# Patient Record
Sex: Female | Born: 1935 | Race: White | Hispanic: No | Smoking: Never smoker
Health system: Southern US, Community
[De-identification: ages and names within clinical notes are randomized; demographics above are authoritative.]

## PROBLEM LIST (undated history)

## (undated) DIAGNOSIS — I10 Essential (primary) hypertension: Secondary | ICD-10-CM

## (undated) DIAGNOSIS — N289 Disorder of kidney and ureter, unspecified: Secondary | ICD-10-CM

## (undated) DIAGNOSIS — K5190 Ulcerative colitis, unspecified, without complications: Secondary | ICD-10-CM

## (undated) HISTORY — PX: CATARACT EXTRACTION: SUR2

## (undated) HISTORY — PX: BREAST SURGERY: SHX581

---

## 2010-11-04 ENCOUNTER — Emergency Department (HOSPITAL_BASED_OUTPATIENT_CLINIC_OR_DEPARTMENT_OTHER)
Admission: EM | Admit: 2010-11-04 | Discharge: 2010-11-04 | Payer: Self-pay | Source: Home / Self Care | Admitting: Emergency Medicine

## 2010-11-04 ENCOUNTER — Ambulatory Visit: Payer: Self-pay | Admitting: Diagnostic Radiology

## 2016-08-24 ENCOUNTER — Emergency Department (HOSPITAL_BASED_OUTPATIENT_CLINIC_OR_DEPARTMENT_OTHER)
Admission: EM | Admit: 2016-08-24 | Discharge: 2016-08-24 | Disposition: A | Discharge status: Home / Self Care | Payer: Medicare HMO | Attending: Emergency Medicine | Admitting: Emergency Medicine

## 2016-08-24 ENCOUNTER — Encounter (HOSPITAL_BASED_OUTPATIENT_CLINIC_OR_DEPARTMENT_OTHER): Payer: Self-pay | Admitting: Adult Health

## 2016-08-24 DIAGNOSIS — K625 Hemorrhage of anus and rectum: Secondary | ICD-10-CM | POA: Diagnosis present

## 2016-08-24 DIAGNOSIS — K642 Third degree hemorrhoids: Secondary | ICD-10-CM | POA: Insufficient documentation

## 2016-08-24 DIAGNOSIS — I10 Essential (primary) hypertension: Secondary | ICD-10-CM | POA: Diagnosis not present

## 2016-08-24 HISTORY — DX: Disorder of kidney and ureter, unspecified: N28.9

## 2016-08-24 HISTORY — DX: Ulcerative colitis, unspecified, without complications: K51.90

## 2016-08-24 HISTORY — DX: Essential (primary) hypertension: I10

## 2016-08-24 LAB — CBC
HCT: 35.2 % — ABNORMAL LOW (ref 36.0–46.0)
Hemoglobin: 11.4 g/dL — ABNORMAL LOW (ref 12.0–15.0)
MCH: 30.4 pg (ref 26.0–34.0)
MCHC: 32.4 g/dL (ref 30.0–36.0)
MCV: 93.9 fL (ref 78.0–100.0)
PLATELETS: 232 10*3/uL (ref 150–400)
RBC: 3.75 MIL/uL — AB (ref 3.87–5.11)
RDW: 14.1 % (ref 11.5–15.5)
WBC: 5.8 10*3/uL (ref 4.0–10.5)

## 2016-08-24 MED ORDER — HYDROCORTISONE ACE-PRAMOXINE 1-1 % RE FOAM: 1.0000 | Freq: Two times a day (BID) | RECTAL | 0 refills | Status: AC

## 2016-08-24 NOTE — ED Notes (Signed)
EDP at [facility] 

## 2016-08-24 NOTE — ED Triage Notes (Signed)
Presents with rectal bleeding began about 30 minutes ago, one episode of large bloody stool with bright red blood and clots. HX of ulcerative colitis, denies use of blood thinners, denies abdominal pain. Endorses fatigue.

## 2016-08-24 NOTE — ED Provider Notes (Signed)
[facility] Note   CSN: [id number] Arrival date & time: 08/24/16  2016   By signing my name below, I, Nelwyn Salisbury, attest that this documentation has been prepared under the direction and in the presence of Lyndal Pulley, MD . Electronically Signed: Nelwyn Salisbury, Scribe. 08/24/2016. 8:34 PM.  History   Chief Complaint Chief Complaint  Patient presents with  . Rectal Bleeding   The history is provided by the patient and a relative. No language interpreter was used.  Rectal Bleeding  Quality:  Bright red Amount:  Moderate Duration:  1 hour Timing:  Constant Chronicity:  New Context: defecation and hemorrhoids   Relieved by:  Nothing Worsened by:  Nothing Ineffective treatments:  None tried Associated symptoms: no abdominal pain, no dizziness and no fever     HPI Comments:  Ariana Allen is a 82 y.o. female with PMHx of HTN and UC who presents to the Emergency Department complaining of sudden-onset severe rectal bleeding beginning about an hour ago. Pt reports that initially there was a large amount of blood, but it seems to be gradually improving. Pt denies any fever, dizziness or hx of anemia.  Past Medical History:  Diagnosis Date  . Hypertension   . Renal disorder   . Ulcerative colitis (HCC)     There are no active problems to display for this patient.   History reviewed. No pertinent surgical history.  OB History    No data available       Home Medications    Prior to Admission medications   Not on File    Family History History reviewed. No pertinent family history.  Social History Social History  Substance Use Topics  . Smoking status: Never Smoker  . Smokeless tobacco: Current User  . Alcohol use No     Allergies   Nitrofuran derivatives   Review of Systems Review of Systems  Constitutional: Negative for fever.  Gastrointestinal: Positive for anal bleeding and hematochezia. Negative for abdominal pain.    Neurological: Negative for dizziness.  All other systems reviewed and are negative.    Physical Exam Updated Vital Signs BP 156/60 (BP Location: Left Arm)   Pulse 68   Temp 97.6 F (36.4 C) (Oral)   Resp 18   Wt 196 lb (88.9 kg)   SpO2 96%   Physical Exam  Constitutional: She appears well-developed and well-nourished. No distress.  HENT:  Head: Normocephalic and atraumatic.  Eyes: Conjunctivae are normal.  Neck: Neck supple.  Cardiovascular: Normal rate and regular rhythm.   No murmur heard. Pulmonary/Chest: Effort normal and breath sounds normal. No respiratory distress.  Abdominal: Soft. There is no tenderness.  Genitourinary:  Genitourinary Comments: Multiple large external hemorrhoids. No gross blood. Soft, non-thrombosed.  Musculoskeletal: She exhibits no edema.  Neurological: She is alert.  Skin: Skin is warm and dry.  Psychiatric: She has a normal mood and affect.  Nursing note and vitals reviewed.    ED Treatments / Results  DIAGNOSTIC STUDIES:  Oxygen Saturation is 96% on RA, normal by my interpretation.    COORDINATION OF CARE:  8:50 PM Discussed treatment plan with pt at bedside which included blood work and pt agreed to plan.  Labs (all labs ordered are listed, but only abnormal results are displayed) Labs Reviewed  CBC - Abnormal; Notable for the following:       Result Value   RBC 3.75 (*)    Hemoglobin 11.4 (*)    HCT 35.2 (*)  All other components within normal limits    EKG  EKG Interpretation None       Radiology No results found.  Procedures Procedures (including critical care time)  Medications Ordered in ED Medications - No data to display   Initial Impression / Assessment and Plan / ED Course  I have reviewed the triage vital signs and the nursing notes.  Pertinent labs & imaging results that were available during my care of the patient were reviewed by me and considered in my medical decision making (see chart for  details).  Clinical Course    82 y.o. female presents with Rectal bleeding that started tonight and was bright red with small clots. It has since subsided. She has several large protruding external hemorrhoids which were easily reducible that are not actively bleeding. No gross blood on rectal exam. CBC with hemoglobin of 11. I recommended hydrocortisone foam to help constrict the hemorrhoids with stool softeners and other supportive care measures pending outpatient follow-up. Return precautions were discussed for signs and symptoms of continued bleeding or anemia.  Final Clinical Impressions(s) / ED Diagnoses   Final diagnoses:  Rectal bleeding  Third degree hemorrhoids    New Prescriptions Discharge Medication List as of 08/24/2016  9:47 PM    START taking these medications   Details  hydrocortisone-pramoxine (PROCTOFOAM HC) rectal foam Place 1 applicator rectally 2 (two) times daily., Starting Sat 08/24/2016, Until Tue 09/03/2016, Print      I personally performed the services described in this documentation, which was scribed in my presence. The recorded information has been reviewed and is accurate.     Lyndal Pulleyaniel Loma [place], MD 08/24/16 [id number]

## 2016-08-24 NOTE — ED Notes (Signed)
Pt alert, NAD, calm, interactive, resps e/u, speaking in clear complete sentences, c/o rectal bleeding, bright red, (denies: nvd, constipation, fever, dizziness, sob, visual changes, back or abd pain, urinary or vaginal sx), h/o hemorrhoids and colitis. [facility]

## 2018-01-12 ENCOUNTER — Encounter (HOSPITAL_COMMUNITY): Payer: Self-pay | Admitting: Family Medicine

## 2018-01-12 ENCOUNTER — Emergency Department (HOSPITAL_COMMUNITY)
Admission: EM | Admit: 2018-01-12 | Discharge: 2018-01-12 | Disposition: A | Discharge status: Home / Self Care | Payer: Medicare HMO | Attending: Emergency Medicine | Admitting: Emergency Medicine

## 2018-01-12 DIAGNOSIS — X509XXA Other and unspecified overexertion or strenuous movements or postures, initial encounter: Secondary | ICD-10-CM | POA: Diagnosis not present

## 2018-01-12 DIAGNOSIS — I10 Essential (primary) hypertension: Secondary | ICD-10-CM | POA: Insufficient documentation

## 2018-01-12 DIAGNOSIS — Y999 Unspecified external cause status: Secondary | ICD-10-CM | POA: Diagnosis not present

## 2018-01-12 DIAGNOSIS — S82831A Other fracture of upper and lower end of right fibula, initial encounter for closed fracture: Secondary | ICD-10-CM | POA: Diagnosis not present

## 2018-01-12 DIAGNOSIS — Y939 Activity, unspecified: Secondary | ICD-10-CM | POA: Insufficient documentation

## 2018-01-12 DIAGNOSIS — Y929 Unspecified place or not applicable: Secondary | ICD-10-CM | POA: Insufficient documentation

## 2018-01-12 DIAGNOSIS — S99911A Unspecified injury of right ankle, initial encounter: Secondary | ICD-10-CM | POA: Diagnosis present

## 2018-01-12 MED ORDER — ACETAMINOPHEN 325 MG PO TABS
650.0000 mg | ORAL_TABLET | Freq: Once | ORAL | Status: AC
  Administered 2018-01-12: 650 mg via ORAL
  Filled 2018-01-12: qty 2

## 2018-01-12 NOTE — ED Notes (Signed)
Bed: WTR8 Expected date:  Expected time:  Means of arrival:  Comments: 

## 2018-01-12 NOTE — Discharge Instructions (Signed)
As discussed, follow the rice protocol outlined in these instructions and wear the Cam walker. Tylenol as needed for pain. Follow up with Dr. Victorino DikeHewitt in office by calling tomorrow morning.  Return if symptoms worsen or new concerning symptoms in the meantime.

## 2018-01-12 NOTE — ED Provider Notes (Signed)
[facility]-EMERGENCY DEPT Provider Note   CSN: [id number] Arrival date & time: 01/12/18  1719     History   Chief Complaint Chief Complaint  Patient presents with  . Ankle Injury    HPI Ariana Allen is a 84 y.o. female presenting from clinic with known right ankle fracture.  She had imaging performed prior to arrival.  She was told to come to the emergency department to be evaluated.  She twisted her ankle yesterday causing her to fall, denies any head trauma or loss of consciousness, no anticoagulant use, no other injury or trauma.  She did not think anything was broken and continued to bear weight, but the pain eventually increased and she decided to be evaluated.  No numbness, weakness or other symptoms.  HPI  Past Medical History:  Diagnosis Date  . Hypertension   . Renal disorder   . Ulcerative colitis (HCC)     There are no active problems to display for this patient.   Past Surgical History:  Procedure Laterality Date  . BREAST SURGERY     Right; Had cancerous cells and tumor but the whole breast was removed with no chemotherapy or radiation.    Marland Kitchen CATARACT EXTRACTION Bilateral     OB History    No data available       Home Medications    Prior to Admission medications   Not on File    Family History History reviewed. No pertinent family history.  Social History Social History   Tobacco Use  . Smoking status: Never Smoker  . Smokeless tobacco: Current User  Substance Use Topics  . Alcohol use: No  . Drug use: No     Allergies   Nitrofuran derivatives   Review of Systems Review of Systems  Respiratory: Negative for shortness of breath.   Cardiovascular: Negative for chest pain.  Gastrointestinal: Negative for nausea and vomiting.  Musculoskeletal: Positive for arthralgias and joint swelling. Negative for back pain, gait problem, myalgias, neck pain and neck stiffness.  Skin: Negative for color change, pallor,  rash and wound.  Neurological: Negative for dizziness, syncope, weakness, light-headedness, numbness and headaches.     Physical Exam Updated Vital Signs Ht 4\' 11"  (1.499 m)   Wt 88.9 kg (196 lb)   BMI 39.59 kg/m  Vitals from the office just prior to arrival to ED:  BP 136/78, HR 76, temperature 98 F, SPO2 97% on room air.  Physical Exam  Constitutional: She appears well-developed and well-nourished. No distress.  Well-appearing 84 year old female, sitting comfortably in chair in no acute distress.  HENT:  Head: Normocephalic and atraumatic.  Eyes: Conjunctivae and EOM are normal.  Neck: Normal range of motion. Neck supple.  Cardiovascular: Normal rate, regular rhythm and intact distal pulses.  Strong dorsalis pedis pulses with normal rate and rhythm  Pulmonary/Chest: Effort normal and breath sounds normal. No stridor. No respiratory distress. She has no wheezes. She has no rales.  Abdominal: She exhibits no distension.  Musculoskeletal: Normal range of motion. She exhibits tenderness. She exhibits no edema or deformity.  Tenderness palpation of the lateral malleolus.  No tenderness palpation of the medial malleolus.  Negative anterior drawer.  Full range of motion.  5 out of 5 strength to plantar flexion dorsiflexion  Neurological: She is alert. No sensory deficit. She exhibits normal muscle tone.  Skin: Skin is warm and dry. Capillary refill takes less than 2 seconds. No rash noted. She is not diaphoretic. No erythema. No pallor.  Psychiatric: She has a normal mood and affect.  Nursing note and vitals reviewed.    ED Treatments / Results  Labs (all labs ordered are listed, but only abnormal results are displayed) Labs Reviewed - No data to display  EKG  EKG Interpretation None       Radiology No results found.  Procedures Procedures (including critical care time)  Medications Ordered in ED Medications  acetaminophen (TYLENOL) tablet 650 mg (650 mg Oral Given  01/12/18 1949)     Initial Impression / Assessment and Plan / ED Course  I have reviewed the triage vital signs and the nursing notes.  Pertinent labs & imaging results that were available during my care of the patient were reviewed by me and considered in my medical decision making (see chart for details).    Patient presenting from outpatient clinic with known right nondisplaced distal fibular fracture.  Sent to the emergency department for further evaluation and management. Reviewed radiology report from care everywhere.  No other injuries or trauma.  Patient is a very well-appearing 84 year old female.  Ordered Cam walker and will discharge home with close follow-up with orthopedics.  Patient was discussed with Dr. Linwood DibblesJon Knapp who agrees with assessment and plan.  Discussed strict return precautions and advised to return to the emergency department if experiencing any new or worsening symptoms. Instructions were understood and patient agreed with discharge plan. Final Clinical Impressions(s) / ED Diagnoses   Final diagnoses:  Closed fracture of distal end of right fibula, unspecified fracture morphology, initial encounter    ED Discharge Orders    None       Gregary CromerMitchell, Daysia Vandenboom B, PA-C 01/12/18 2330    Linwood DibblesKnapp, Jon, MD 01/13/18 0004

## 2018-01-12 NOTE — ED Triage Notes (Signed)
Patient is coming in for a right ankle fracture: blique nondisplaced fractures through the distal right fibular metaphysis. X-rays were performed earlier today and referred for further treatment. Patient reports she twisted her right ankle while tripping over a rug last night. She has used ice to the area and rest for comfort measures.

## 2018-03-04 ENCOUNTER — Emergency Department (HOSPITAL_BASED_OUTPATIENT_CLINIC_OR_DEPARTMENT_OTHER)
Admission: EM | Admit: 2018-03-04 | Discharge: 2018-03-05 | Disposition: A | Discharge status: Home / Self Care | Payer: Medicare HMO | Attending: Emergency Medicine | Admitting: Emergency Medicine

## 2018-03-04 ENCOUNTER — Other Ambulatory Visit: Payer: Self-pay

## 2018-03-04 ENCOUNTER — Encounter (HOSPITAL_BASED_OUTPATIENT_CLINIC_OR_DEPARTMENT_OTHER): Payer: Self-pay

## 2018-03-04 DIAGNOSIS — K644 Residual hemorrhoidal skin tags: Secondary | ICD-10-CM | POA: Diagnosis not present

## 2018-03-04 DIAGNOSIS — Z9011 Acquired absence of right breast and nipple: Secondary | ICD-10-CM | POA: Diagnosis not present

## 2018-03-04 DIAGNOSIS — I10 Essential (primary) hypertension: Secondary | ICD-10-CM | POA: Insufficient documentation

## 2018-03-04 DIAGNOSIS — Z79899 Other long term (current) drug therapy: Secondary | ICD-10-CM | POA: Diagnosis not present

## 2018-03-04 DIAGNOSIS — F17290 Nicotine dependence, other tobacco product, uncomplicated: Secondary | ICD-10-CM | POA: Insufficient documentation

## 2018-03-04 DIAGNOSIS — K625 Hemorrhage of anus and rectum: Secondary | ICD-10-CM | POA: Diagnosis present

## 2018-03-04 LAB — COMPREHENSIVE METABOLIC PANEL
ALT: 10 U/L — ABNORMAL LOW (ref 14–54)
AST: 16 U/L (ref 15–41)
Albumin: 3.8 g/dL (ref 3.5–5.0)
Alkaline Phosphatase: 56 U/L (ref 38–126)
Anion gap: 8 (ref 5–15)
BUN: 36 mg/dL — AB (ref 6–20)
CHLORIDE: 111 mmol/L (ref 101–111)
CO2: 19 mmol/L — ABNORMAL LOW (ref 22–32)
Calcium: 8.5 mg/dL — ABNORMAL LOW (ref 8.9–10.3)
Creatinine, Ser: 1.58 mg/dL — ABNORMAL HIGH (ref 0.44–1.00)
GFR calc Af Amer: 31 mL/min — ABNORMAL LOW (ref >60)
GFR, EST NON AFRICAN AMERICAN: 26 mL/min — AB (ref >60)
Glucose, Bld: 98 mg/dL (ref 65–99)
POTASSIUM: 4.6 mmol/L (ref 3.5–5.1)
Sodium: 138 mmol/L (ref 135–145)
Total Bilirubin: 0.6 mg/dL (ref 0.3–1.2)
Total Protein: 6.2 g/dL — ABNORMAL LOW (ref 6.5–8.1)

## 2018-03-04 LAB — CBC
HEMATOCRIT: 33.7 % — AB (ref 36.0–46.0)
Hemoglobin: 10.6 g/dL — ABNORMAL LOW (ref 12.0–15.0)
MCH: 29.5 pg (ref 26.0–34.0)
MCHC: 31.5 g/dL (ref 30.0–36.0)
MCV: 93.9 fL (ref 78.0–100.0)
Platelets: 276 10*3/uL (ref 150–400)
RBC: 3.59 MIL/uL — ABNORMAL LOW (ref 3.87–5.11)
RDW: 14.7 % (ref 11.5–15.5)
WBC: 6 10*3/uL (ref 4.0–10.5)

## 2018-03-04 NOTE — ED Triage Notes (Signed)
C/o rectal bleeding started approx 9pm-pt NAD-presents to triage in w/c

## 2018-03-04 NOTE — ED Provider Notes (Addendum)
[facility] Provider Note: Lowella DellJ. Lane Toshiro Hanken, MD, FACEP  CSN: [id number] MRN: [id number] ARRIVAL: 03/04/18 at 2202 ROOM: MH04/[facility]04   CHIEF COMPLAINT  Rectal Bleeding   HISTORY OF PRESENT ILLNESS  03/04/18 11:50 PM Ariana SkeansJosephine Newborn is a 84 y.o. female with a history of ulcerative colitis.  She is here after having a bowel movement about 9 PM that initially consisted of stool but was followed by copious bright red blood.  She is having minimal left lower and right lower quadrant discomfort which she rates as a 2 out of 10.  She also has a history of bleeding hemorrhoids.  She is not having nausea or vomiting.   Past Medical History:  Diagnosis Date  . Hypertension   . Renal disorder   . Ulcerative colitis [facility](HCC)     Past Surgical History:  Procedure Laterality Date  . BREAST SURGERY     Right; Had cancerous cells and tumor but the whole breast was removed with no chemotherapy or radiation.    Marland Kitchen. CATARACT EXTRACTION Bilateral     No family history on file.  Social History   Tobacco Use  . Smoking status: Never Smoker  . Smokeless tobacco: Current User  Substance Use Topics  . Alcohol use: No  . Drug use: No    Prior to Admission medications   Medication Sig Start Date End Date Taking? Authorizing Provider  fenofibrate (TRICOR) 48 MG tablet Take 48 mg by mouth daily.   Yes [provider]  folic acid (FOLVITE) 1 MG tablet Take 1 mg by mouth daily.   Yes [provider]  lisinopril (PRINIVIL,ZESTRIL) 20 MG tablet Take 20 mg by mouth daily.   Yes [provider]  metoprolol tartrate (LOPRESSOR) 100 MG tablet Take 100 mg by mouth 2 (two) times daily.   Yes [provider]  sulfaSALAzine (AZULFIDINE) 500 MG tablet Take 500 mg by mouth 4 (four) times daily.   Yes [provider]    Allergies Nitrofuran derivatives   REVIEW OF SYSTEMS  Negative except as noted here or in the History of Present Illness.   PHYSICAL  EXAMINATION  Initial Vital Signs Blood pressure (!) 174/83, pulse (!) 57, temperature 97.9 F (36.6 C), temperature source Oral, resp. rate 18, height 4\' 11"  (1.499 m), weight 89.4 kg (197 lb), SpO2 100 %.  Examination General: Well-developed, well-nourished female in no acute distress; appears younger than age of record HENT: normocephalic; atraumatic Eyes: pupils equal, round and reactive to light; extraocular muscles intact Neck: supple Heart: regular rate and rhythm Lungs: clear to auscultation bilaterally Abdomen: soft; nondistended; nontender; no masses or hepatosplenomegaly; bowel sounds present Rectal: Multiple large external hemorrhoids, one grossly inflamed but not thrombosed; dried blood on perianal region; normal sphincter tone; soft, tan stool in rectal vault Extremities: No deformity; full range of motion; pulses normal; +1 edema of the lower legs and feet Neurologic: Awake, alert and oriented; motor function intact in all extremities and symmetric; no facial droop Skin: Warm and dry Psychiatric: Normal mood and affect   RESULTS  Summary of this visit's results, reviewed by myself:   EKG Interpretation  Date/Time:    Ventricular Rate:    PR Interval:    QRS Duration:   QT Interval:    QTC Calculation:   R Axis:     Text Interpretation:        Laboratory Studies: Results for orders placed or performed during the hospital encounter of 03/04/18 (from the past 24 hour(s))  Comprehensive metabolic panel     Status: Abnormal   Collection Time: 03/04/18 10:30 PM  Result Value Ref Range   Sodium 138 135 - 145 mmol/L   Potassium 4.6 3.5 - 5.1 mmol/L   Chloride 111 101 - 111 mmol/L   CO2 19 (L) 22 - 32 mmol/L   Glucose, Bld 98 65 - 99 mg/dL   BUN 36 (H) 6 - 20 mg/dL   Creatinine, Ser 1.61 (H) 0.44 - 1.00 mg/dL   Calcium 8.5 (L) 8.9 - 10.3 mg/dL   Total Protein 6.2 (L) 6.5 - 8.1 g/dL   Albumin 3.8 3.5 - 5.0 g/dL   AST 16 15 - 41 U/L   ALT 10 (L) 14 - 54 U/L    Alkaline Phosphatase 56 38 - 126 U/L   Total Bilirubin 0.6 0.3 - 1.2 mg/dL   GFR calc non Af Amer 26 (L) >60 mL/min   GFR calc Af Amer 31 (L) >60 mL/min   Anion gap 8 5 - 15  CBC     Status: Abnormal   Collection Time: 03/04/18 10:30 PM  Result Value Ref Range   WBC 6.0 4.0 - 10.5 K/uL   RBC 3.59 (L) 3.87 - 5.11 MIL/uL   Hemoglobin 10.6 (L) 12.0 - 15.0 g/dL   HCT 09.6 (L) 04.5 - 40.9 %   MCV 93.9 78.0 - 100.0 fL   MCH 29.5 26.0 - 34.0 pg   MCHC 31.5 30.0 - 36.0 g/dL   RDW 81.1 91.4 - 78.2 %   Platelets 276 150 - 400 K/uL  Occult blood card to lab, stool Provider will collect     Status: None   Collection Time: 03/04/18 11:55 PM  Result Value Ref Range   Fecal Occult Bld NEGATIVE NEGATIVE   Imaging Studies: No results found.  ED COURSE  Nursing notes and initial vitals signs, including pulse oximetry, reviewed.  Vitals:   03/04/18 2212  BP: (!) 174/83  Pulse: (!) 57  Resp: 18  Temp: 97.9 F (36.6 C)  TempSrc: Oral  SpO2: 100%  Weight: 89.4 kg (197 lb)  Height: 4\' 11"  (1.499 m)   12:18 AM Examination consistent with hemorrhoidal bleeding.  Her rectal exam was negative for hematochezia, melena or occult blood.  PROCEDURES    ED DIAGNOSES     ICD-10-CM   1. Bleeding external hemorrhoids K64.4        Ariana Allen, Ariana Ruiz, MD 03/05/18 0019    Paula Libra, MD 03/05/18 9562

## 2018-03-04 NOTE — ED Notes (Signed)
ED Provider at bedside. 

## 2018-03-05 LAB — OCCULT BLOOD X 1 CARD TO LAB, STOOL: Fecal Occult Bld: NEGATIVE

## 2018-03-05 MED ORDER — HYDROCORTISONE 2.5 % RE CREA: 1.0000 "application " | TOPICAL_CREAM | Freq: Two times a day (BID) | RECTAL | 0 refills | Status: AC

## 2021-05-22 ENCOUNTER — Encounter (HOSPITAL_BASED_OUTPATIENT_CLINIC_OR_DEPARTMENT_OTHER): Payer: Self-pay

## 2021-05-22 ENCOUNTER — Emergency Department (HOSPITAL_BASED_OUTPATIENT_CLINIC_OR_DEPARTMENT_OTHER): Payer: Medicare HMO

## 2021-05-22 ENCOUNTER — Other Ambulatory Visit: Payer: Self-pay

## 2021-05-22 ENCOUNTER — Emergency Department (HOSPITAL_BASED_OUTPATIENT_CLINIC_OR_DEPARTMENT_OTHER)
Admission: EM | Admit: 2021-05-22 | Discharge: 2021-05-22 | Disposition: A | Discharge status: Home / Self Care | Payer: Medicare HMO | Attending: Emergency Medicine | Admitting: Emergency Medicine

## 2021-05-22 DIAGNOSIS — Z79899 Other long term (current) drug therapy: Secondary | ICD-10-CM | POA: Diagnosis not present

## 2021-05-22 DIAGNOSIS — Y92019 Unspecified place in single-family (private) house as the place of occurrence of the external cause: Secondary | ICD-10-CM | POA: Diagnosis not present

## 2021-05-22 DIAGNOSIS — I10 Essential (primary) hypertension: Secondary | ICD-10-CM | POA: Insufficient documentation

## 2021-05-22 DIAGNOSIS — Y9389 Activity, other specified: Secondary | ICD-10-CM | POA: Insufficient documentation

## 2021-05-22 DIAGNOSIS — R531 Weakness: Secondary | ICD-10-CM | POA: Diagnosis present

## 2021-05-22 DIAGNOSIS — R202 Paresthesia of skin: Secondary | ICD-10-CM | POA: Insufficient documentation

## 2021-05-22 DIAGNOSIS — W19XXXA Unspecified fall, initial encounter: Secondary | ICD-10-CM | POA: Insufficient documentation

## 2021-05-22 LAB — CBC WITH DIFFERENTIAL/PLATELET
Abs Immature Granulocytes: 0.04 10*3/uL (ref 0.00–0.07)
Basophils Absolute: 0 10*3/uL (ref 0.0–0.1)
Basophils Relative: 1 %
Eosinophils Absolute: 0 10*3/uL (ref 0.0–0.5)
Eosinophils Relative: 0 %
HCT: 33.2 % — ABNORMAL LOW (ref 36.0–46.0)
Hemoglobin: 10.9 g/dL — ABNORMAL LOW (ref 12.0–15.0)
Immature Granulocytes: 1 %
Lymphocytes Relative: 25 %
Lymphs Abs: 1.2 10*3/uL (ref 0.7–4.0)
MCH: 31.2 pg (ref 26.0–34.0)
MCHC: 32.8 g/dL (ref 30.0–36.0)
MCV: 95.1 fL (ref 80.0–100.0)
Monocytes Absolute: 0.9 10*3/uL (ref 0.1–1.0)
Monocytes Relative: 18 %
Neutro Abs: 2.7 10*3/uL (ref 1.7–7.7)
Neutrophils Relative %: 55 %
Platelets: 156 10*3/uL (ref 150–400)
RBC: 3.49 MIL/uL — ABNORMAL LOW (ref 3.87–5.11)
RDW: 15 % (ref 11.5–15.5)
WBC: 4.8 10*3/uL (ref 4.0–10.5)
nRBC: 0 % (ref 0.0–0.2)

## 2021-05-22 LAB — URINALYSIS, ROUTINE W REFLEX MICROSCOPIC
Bilirubin Urine: NEGATIVE
Glucose, UA: NEGATIVE mg/dL
Ketones, ur: NEGATIVE mg/dL
Nitrite: NEGATIVE
Protein, ur: 30 mg/dL — AB
Specific Gravity, Urine: 1.015 (ref 1.005–1.030)
pH: 6 (ref 5.0–8.0)

## 2021-05-22 LAB — TROPONIN I (HIGH SENSITIVITY): Troponin I (High Sensitivity): 5 ng/L (ref <18)

## 2021-05-22 LAB — MAGNESIUM: Magnesium: 1.7 mg/dL (ref 1.7–2.4)

## 2021-05-22 LAB — COMPREHENSIVE METABOLIC PANEL
ALT: 16 U/L (ref 0–44)
AST: 18 U/L (ref 15–41)
Albumin: 3.8 g/dL (ref 3.5–5.0)
Alkaline Phosphatase: 94 U/L (ref 38–126)
Anion gap: 5 (ref 5–15)
BUN: 37 mg/dL — ABNORMAL HIGH (ref 8–23)
CO2: 21 mmol/L — ABNORMAL LOW (ref 22–32)
Calcium: 8.5 mg/dL — ABNORMAL LOW (ref 8.9–10.3)
Chloride: 113 mmol/L — ABNORMAL HIGH (ref 98–111)
Creatinine, Ser: 1.54 mg/dL — ABNORMAL HIGH (ref 0.44–1.00)
GFR, Estimated: 30 mL/min — ABNORMAL LOW (ref >60)
Glucose, Bld: 102 mg/dL — ABNORMAL HIGH (ref 70–99)
Potassium: 5.2 mmol/L — ABNORMAL HIGH (ref 3.5–5.1)
Sodium: 139 mmol/L (ref 135–145)
Total Bilirubin: 0.2 mg/dL — ABNORMAL LOW (ref 0.3–1.2)
Total Protein: 6.3 g/dL — ABNORMAL LOW (ref 6.5–8.1)

## 2021-05-22 LAB — CK: Total CK: 83 U/L (ref 38–234)

## 2021-05-22 LAB — URINALYSIS, MICROSCOPIC (REFLEX)

## 2021-05-22 MED ORDER — SODIUM CHLORIDE 0.9 % IV BOLUS
1000.0000 mL | Freq: Once | INTRAVENOUS | Status: AC
  Administered 2021-05-22: 1000 mL via INTRAVENOUS

## 2021-05-22 NOTE — Discharge Instructions (Signed)
If your weakness does not improve or seems to worsen or you develop one-sided weakness, numbness, trouble speaking, headache, or any other new/concerning symptoms then return to the ER for evaluation.

## 2021-05-22 NOTE — ED Provider Notes (Signed)
[facility] EMERGENCY DEPARTMENT Provider Note   CSN: [id number] Arrival date & time: 05/22/21  1444     History Chief Complaint  Patient presents with  . Fall    Ariana Allen is a 87 y.o. female.  HPI 87 year old female presents via EMS with weakness. She fell after getting out of bed ~2 AM while trying to go to the bathroom. Does not think she passed out. No obvious injury though her back feels like it might be bruised tomorrow. She could sit up but couldn't stand up. Legs didn't feel strong enough. No recent illness or change in meds. No fevers. Is not altered according to daughter. Family (lives next door) checked on her ~11 am. Eventually with a couple people were able to get her up to walk. She could walk some with a walker. No recent back pain/incontinence. Feels "numb" which she describes as a tingling diffusely in lower extremities and back.  Past Medical History:  Diagnosis Date  . Hypertension   . Renal disorder   . Ulcerative colitis (HCC)     There are no problems to display for this patient.   Past Surgical History:  Procedure Laterality Date  . BREAST SURGERY     Right; Had cancerous cells and tumor but the whole breast was removed with no chemotherapy or radiation.    Marland Kitchen CATARACT EXTRACTION Bilateral      OB History   No obstetric history on file.     History reviewed. No pertinent family history.  Social History   Tobacco Use  . Smoking status: Never Smoker  . Smokeless tobacco: Current User  Vaping Use  . Vaping Use: Never used  Substance Use Topics  . Alcohol use: No  . Drug use: No    Home Medications Prior to Admission medications   Medication Sig Start Date End Date Taking? Authorizing Provider  fenofibrate (TRICOR) 48 MG tablet Take 48 mg by mouth daily.    [provider]  folic acid (FOLVITE) 1 MG tablet Take 1 mg by mouth daily.    [provider]  hydrocortisone (ANUSOL-HC) 2.5 % rectal cream Place  1 application rectally 2 (two) times daily. 03/05/18   Molpus, John, MD  lisinopril (PRINIVIL,ZESTRIL) 20 MG tablet Take 20 mg by mouth daily.    [provider]  metoprolol tartrate (LOPRESSOR) 100 MG tablet Take 100 mg by mouth 2 (two) times daily.    [provider]  sulfaSALAzine (AZULFIDINE) 500 MG tablet Take 500 mg by mouth 4 (four) times daily.    [provider]    Allergies    Lumigan [bimatoprost] and Nitrofuran derivatives  Review of Systems   Review of Systems  Constitutional: Negative for fever.  Cardiovascular: Negative for chest pain.  Gastrointestinal: Negative for abdominal pain, diarrhea and vomiting.  Genitourinary: Negative for dysuria.  Musculoskeletal: Negative for back pain.  Neurological: Positive for weakness and numbness. Negative for headaches.  All other systems reviewed and are negative.   Physical Exam Updated Vital Signs BP (!) 181/67   Pulse (!) 58   Temp 97.8 F (36.6 C) (Oral)   Resp 16   Ht 4\' 11"  (1.499 m)   Wt 88 kg   SpO2 98%   BMI 39.18 kg/m   Physical Exam Vitals and nursing note reviewed.  Constitutional:      Appearance: She is well-developed.  HENT:     Head: Normocephalic and atraumatic.     Right Ear: External ear  normal.     Left Ear: External ear normal.     Nose: Nose normal.  Eyes:     General:        Right eye: No discharge.        Left eye: No discharge.     Extraocular Movements: Extraocular movements intact.     Pupils: Pupils are equal, round, and reactive to light.  Cardiovascular:     Rate and Rhythm: Normal rate and regular rhythm.     Pulses:          Dorsalis pedis pulses are 2+ on the right side and 2+ on the left side.     Heart sounds: Normal heart sounds.  Pulmonary:     Effort: Pulmonary effort is normal.     Breath sounds: Normal breath sounds.  Abdominal:     Palpations: Abdomen is soft.     Tenderness: There is no abdominal tenderness.  Musculoskeletal:      Comments: No tenderness or bruising through C/T/L spine or flanks  Skin:    General: Skin is warm and dry.  Neurological:     Mental Status: She is alert and oriented to person, place, and time.     Comments: CN 3-12 grossly intact. 5/5 strength in both upper extremities. Bilateral lower extremities cannot lift selves off stretcher, or hold up to gravity. Grossly normal sensation. Normal finger to nose.   Psychiatric:        Mood and Affect: Mood is not anxious.     ED Results / Procedures / Treatments   Labs (all labs ordered are listed, but only abnormal results are displayed) Labs Reviewed  COMPREHENSIVE METABOLIC PANEL - Abnormal; Notable for the following components:      Result Value   Potassium 5.2 (*)    Chloride 113 (*)    CO2 21 (*)    Glucose, Bld 102 (*)    BUN 37 (*)    Creatinine, Ser 1.54 (*)    Calcium 8.5 (*)    Total Protein 6.3 (*)    Total Bilirubin 0.2 (*)    GFR, Estimated 30 (*)    All other components within normal limits  URINALYSIS, ROUTINE W REFLEX MICROSCOPIC - Abnormal; Notable for the following components:   Hgb urine dipstick LARGE (*)    Protein, ur 30 (*)    Leukocytes,Ua SMALL (*)    All other components within normal limits  CBC WITH DIFFERENTIAL/PLATELET - Abnormal; Notable for the following components:   RBC 3.49 (*)    Hemoglobin 10.9 (*)    HCT 33.2 (*)    All other components within normal limits  URINALYSIS, MICROSCOPIC (REFLEX) - Abnormal; Notable for the following components:   Bacteria, UA RARE (*)    All other components within normal limits  URINE CULTURE  MAGNESIUM  CK  CBG MONITORING, ED  TROPONIN I (HIGH SENSITIVITY)    EKG EKG Interpretation  Date/Time:  Tuesday May 22 2021 15:30:59 EDT Ventricular Rate:  55 PR Interval:  139 QRS Duration: 114 QT Interval:  489 QTC Calculation: 468 R Axis:   41 Text Interpretation: Sinus rhythm Incomplete left bundle branch block Artifact in lead(s) I II aVR aVL aVF V1  Interpretation limited secondary to artifact Confirmed by Pricilla Loveless [phone]) on 05/22/2021 3:34:55 PM   Radiology DG Chest 2 View  Result Date: 05/22/2021 CLINICAL DATA:  Weakness. EXAM: CHEST - 2 VIEW COMPARISON:  June 08, 2014 FINDINGS: Mild, chronic appearing increased lung markings are  seen without evidence of acute infiltrate, pleural effusion or pneumothorax. The heart size is within normal limits. Mild, stable right paratracheal vascular prominence is seen. There is moderate severity calcification of the aortic arch. Degenerative changes are seen throughout the thoracic spine. IMPRESSION: No active cardiopulmonary disease. Electronically Signed   By: Aram Candela M.D.   On: 05/22/2021 16:22   CT Head Wo Contrast  Result Date: 05/22/2021 CLINICAL DATA:  Neuro deficit, acute, stroke suspected 87 year old post unwitnessed fall.  Found on floor. EXAM: CT HEAD WITHOUT CONTRAST TECHNIQUE: Contiguous axial images were obtained from the base of the skull through the vertex without intravenous contrast. COMPARISON:  None. FINDINGS: Brain: Age related atrophy. No intracranial hemorrhage, mass effect, or midline shift. No hydrocephalus. The basilar cisterns are patent. Moderate periventricular and deep chronic small vessel ischemia. No evidence of territorial infarct or acute ischemia. No extra-axial or intracranial fluid collection. Vascular: Atherosclerosis of skullbase vasculature without hyperdense vessel or abnormal calcification. Skull: No fracture or focal lesion. Sinuses/Orbits: Subtotal opacification of right side of sphenoid sinus has associated cortical thickening and sclerosis and is chronic. No acute findings. Bilateral cataract resection. Other: None. IMPRESSION: 1. No acute intracranial abnormality. No skull fracture. 2. Age related atrophy and chronic small vessel ischemia. 3. Chronic right sphenoid sinusitis. Electronically Signed   By: Narda Rutherford M.D.   On: 05/22/2021 16:05     Procedures Procedures   Medications Ordered in ED Medications  sodium chloride 0.9 % bolus 1,000 mL (0 mLs Intravenous Stopped 05/22/21 1726)    ED Course  I have reviewed the triage vital signs and the nursing notes.  Pertinent labs & imaging results that were available during my care of the patient were reviewed by me and considered in my medical decision making (see chart for details).    MDM Rules/Calculators/A&P                          Patient feels some generalized weakness but no focal weakness.  She was able to get up and walk with a walker.  She struggled a little bit because it did not have wheels like hers does.  There was some concern from the nurse/daughter that maybe her right leg seemed a little less strong than her left but the patient tells me that this feels normal to her as her right foot has a chronic problem since surgery.  No focal weakness on my exam.  There is no fever or obvious infection.  Her urine is questionable though she also had some hemorrhoid blood which may have caused the hematuria part.  Given no specific urinary symptoms I think is reasonable to send urine for culture but I do not think she needs immediate treatment.  It could just be that she fell and had a hard time getting up due to lack of strength as she has increased in age.  Right now she is able to ambulate and feels well enough for discharge.  I do not think she needs emergent transport for MRI or other acute emergency work-up.  We discussed if this does not improve or seems to worsen she will need to return. Final Clinical Impression(s) / ED Diagnoses Final diagnoses:  Generalized weakness    Rx / DC Orders ED Discharge Orders    None       Pricilla Loveless, MD 05/22/21 1814

## 2021-05-22 NOTE — ED Notes (Signed)
Pt ambulated approx 15-59ft with walker to restroom.  Stopped halfway to use wheelchair for safety.  Pt became overexerted and was experiencing some R leg/foot weakness that is new per family/pt.  Transferred from wheelchair to toilet without difficulty.  1 person assist into bed d/t it being to high.  Noticed mucous-like brown/dark red blood in pad on patient coming from rectum.  Pt states this is likely d/t her Hemorrhoids that are chronic in nature.  MD Criss Alvine notified of observations.

## 2021-05-22 NOTE — ED Triage Notes (Signed)
brought in  By EMS from home , fall x 10 hrs ago , unwitnessed , pt does not remember fall, pt found on floor by family , possible laid  on floor for 10 hrs, c/o right hand swelling and pain.

## 2021-05-22 NOTE — ED Triage Notes (Signed)
Pt states around 2 am going to the bathroom, does not remember falling.  States she did not pass out, states her legs gave out and she could not get up.   Dtg her lives next door and checked on her and found her in the bathroom and they were able to get her up and she walked to living room with her walker.  Pt noticed bruising to her hands, but reports no other injury and denies pain.

## 2021-05-24 LAB — URINE CULTURE

## 2021-08-11 IMAGING — CT CT HEAD W/O CM
3 series · 15 of 47 positions shown, 18 images · non-contrast
Comparison: None.

CLINICAL DATA: Neuro deficit, acute, stroke suspected

[AGE] post unwitnessed fall.  Found on floor.
EXAM:
CT HEAD WITHOUT CONTRAST
TECHNIQUE: Contiguous axial images were obtained from the base of the skull
through the vertex without intravenous contrast.

[Series 2: head wo · axial · 0.45mm/px · z∈[+1216,+1356]mm · 9 of 34 slices shown, 12 images]
[im 3/34  brain]
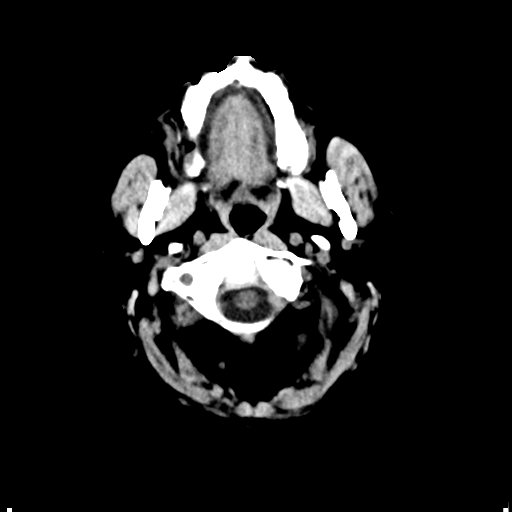
[im 3/34  bone]
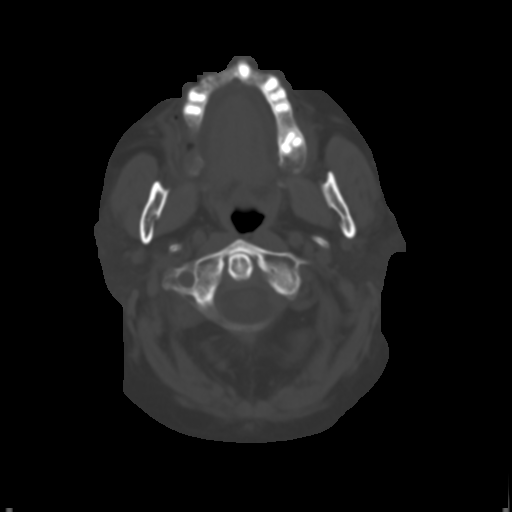
[im 6/34  brain]
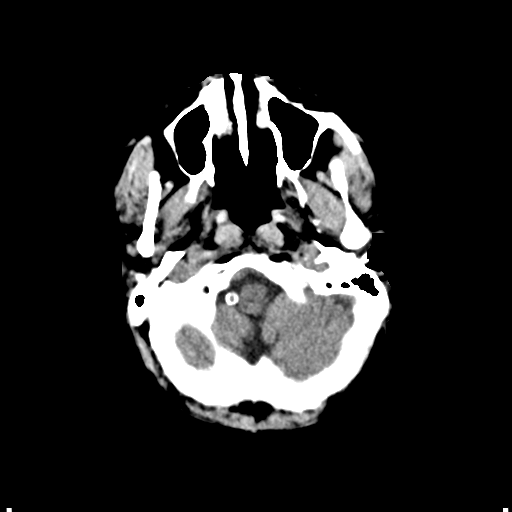
[im 10/34  brain]
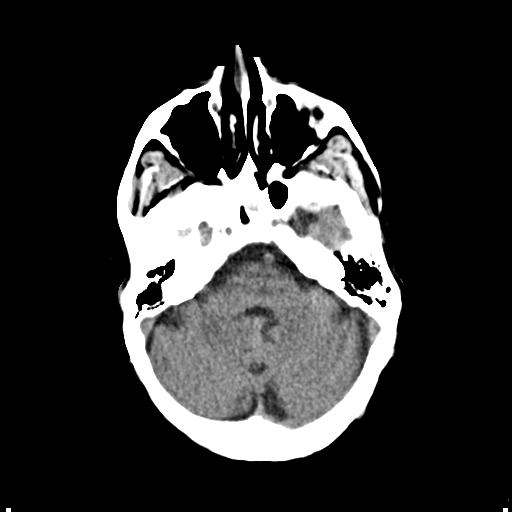
[im 13/34  brain]
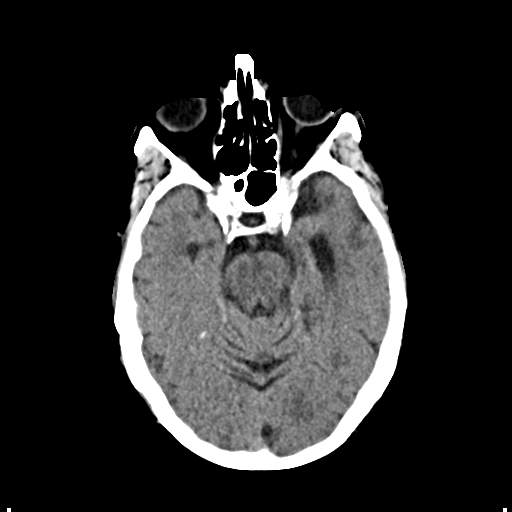
[im 18/34  brain]
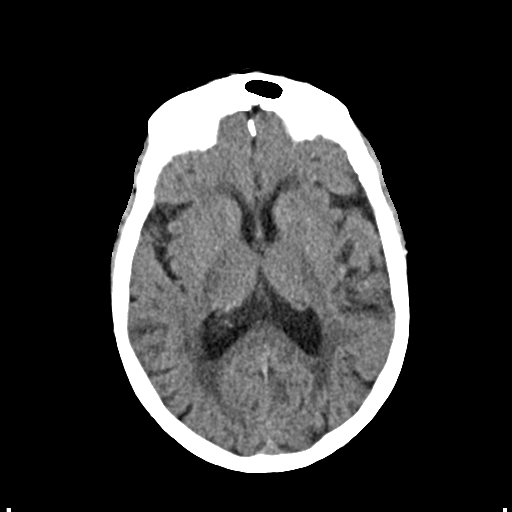
[im 18/34  bone]
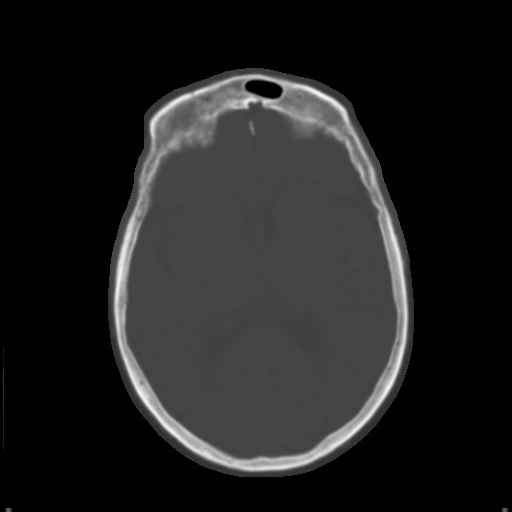
[im 21/34  brain]
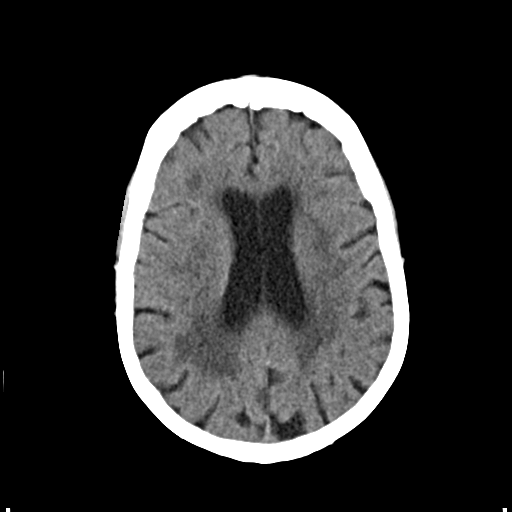
[im 24/34  brain]
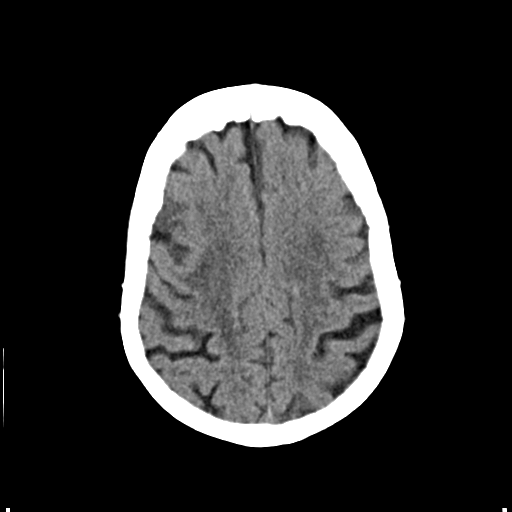
[im 28/34  brain]
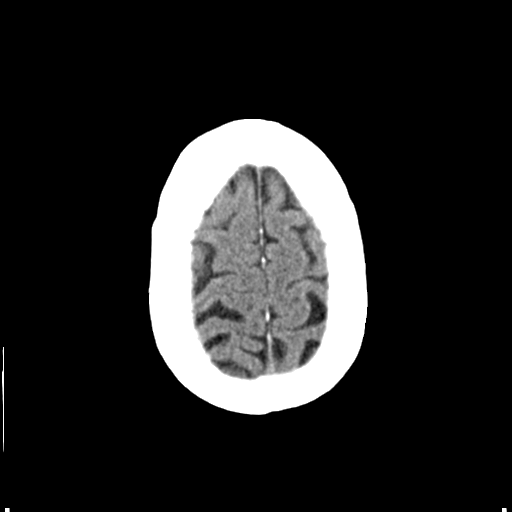
[im 31/34  brain]
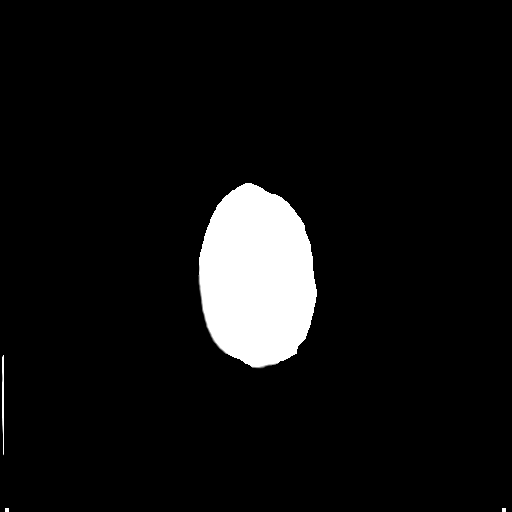
[im 31/34  bone]
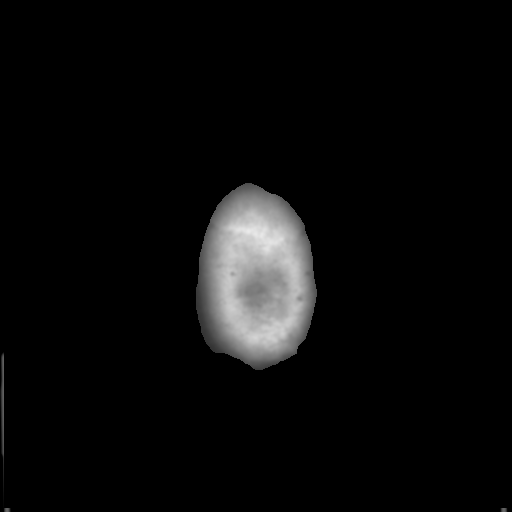

[Series 4: coronal soft · coronal · 0.33mm/px · 3 of 67 slices shown]
[im 23/67  brain]
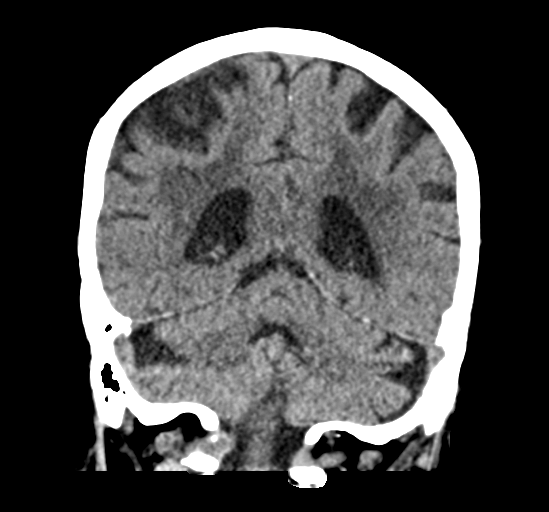
[im 30/67  brain]
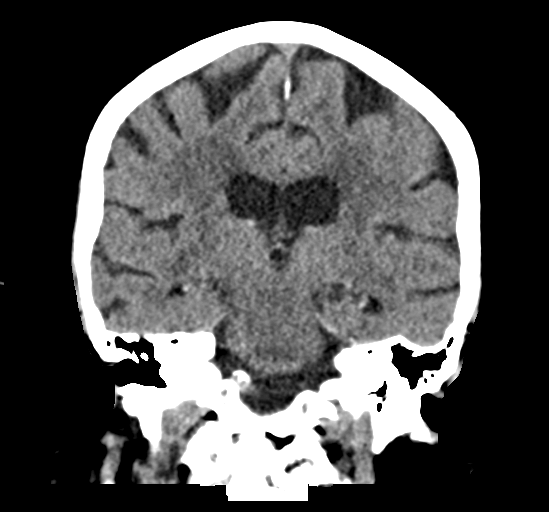
[im 37/67  brain]
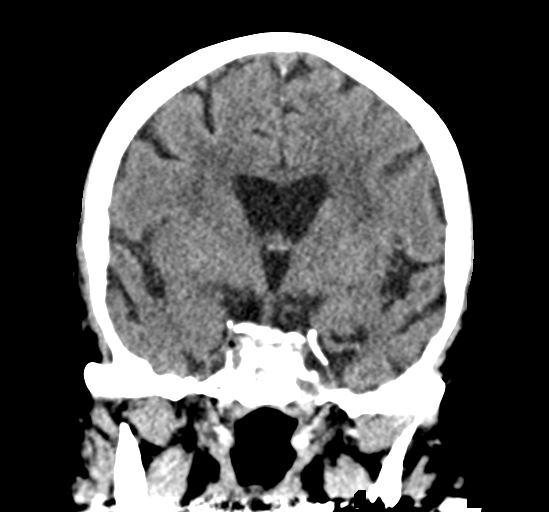

[Series 5: sag soft · sagittal · 0.33mm/px · 3 of 62 slices shown]
[im 21/62  brain]
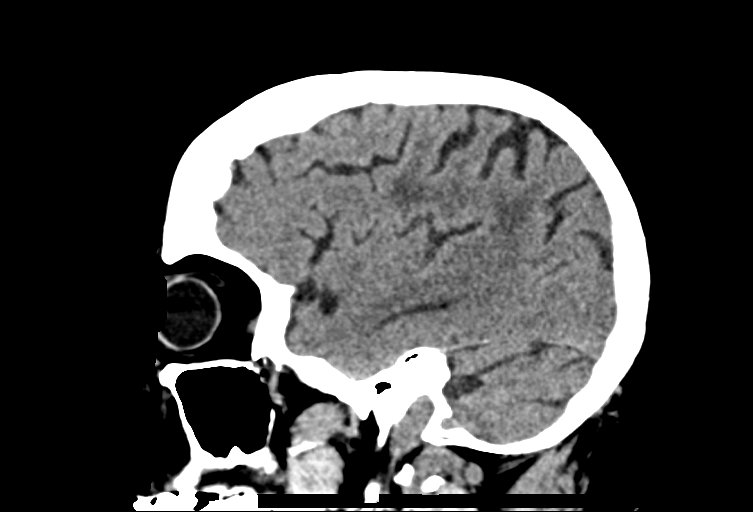
[im 31/62  brain]
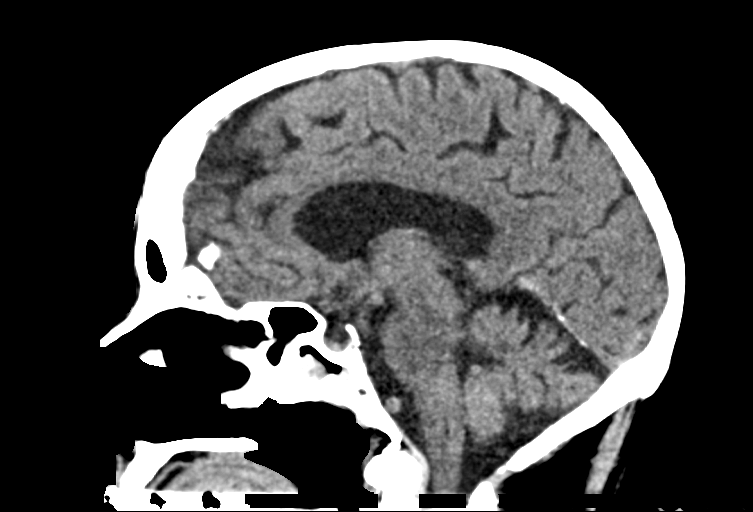
[im 41/62  brain]
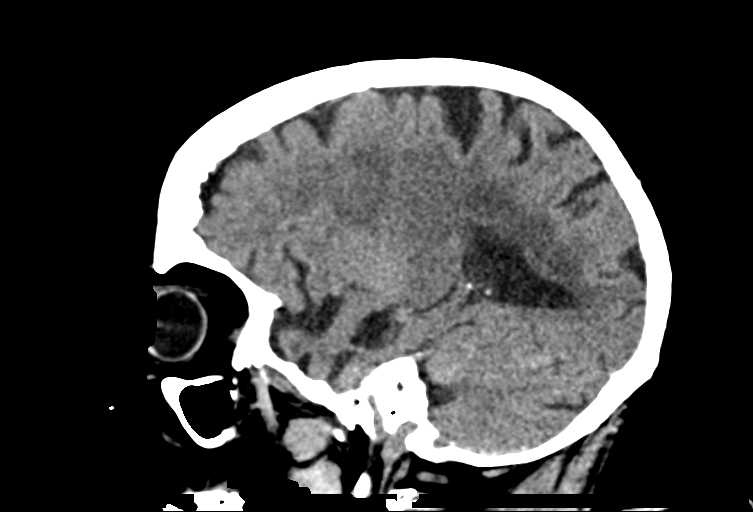

[15 of 47 positions shown; findings below may reference images not displayed]

FINDINGS: Brain: Age related atrophy. No intracranial hemorrhage, mass effect,
or midline shift. No hydrocephalus. The basilar cisterns are patent.
Moderate periventricular and deep chronic small vessel ischemia. No
evidence of territorial infarct or acute ischemia. No extra-axial or
intracranial fluid collection.

Vascular: Atherosclerosis of skullbase vasculature without
hyperdense vessel or abnormal calcification.

Skull: No fracture or focal lesion.

Sinuses/Orbits: Subtotal opacification of right side of sphenoid
sinus has associated cortical thickening and sclerosis and is
chronic. No acute findings. Bilateral cataract resection.

Other: None.
IMPRESSION: 1. No acute intracranial abnormality. No skull fracture.
2. Age related atrophy and chronic small vessel ischemia.
3. Chronic right sphenoid sinusitis.

## 2021-08-11 IMAGING — DX DG CHEST 2V
2 series · 2 of 2 positions shown · non-contrast
Comparison: June 08, 2014

CLINICAL DATA: Weakness.

EXAM:
CHEST - 2 VIEW

[chest lat]
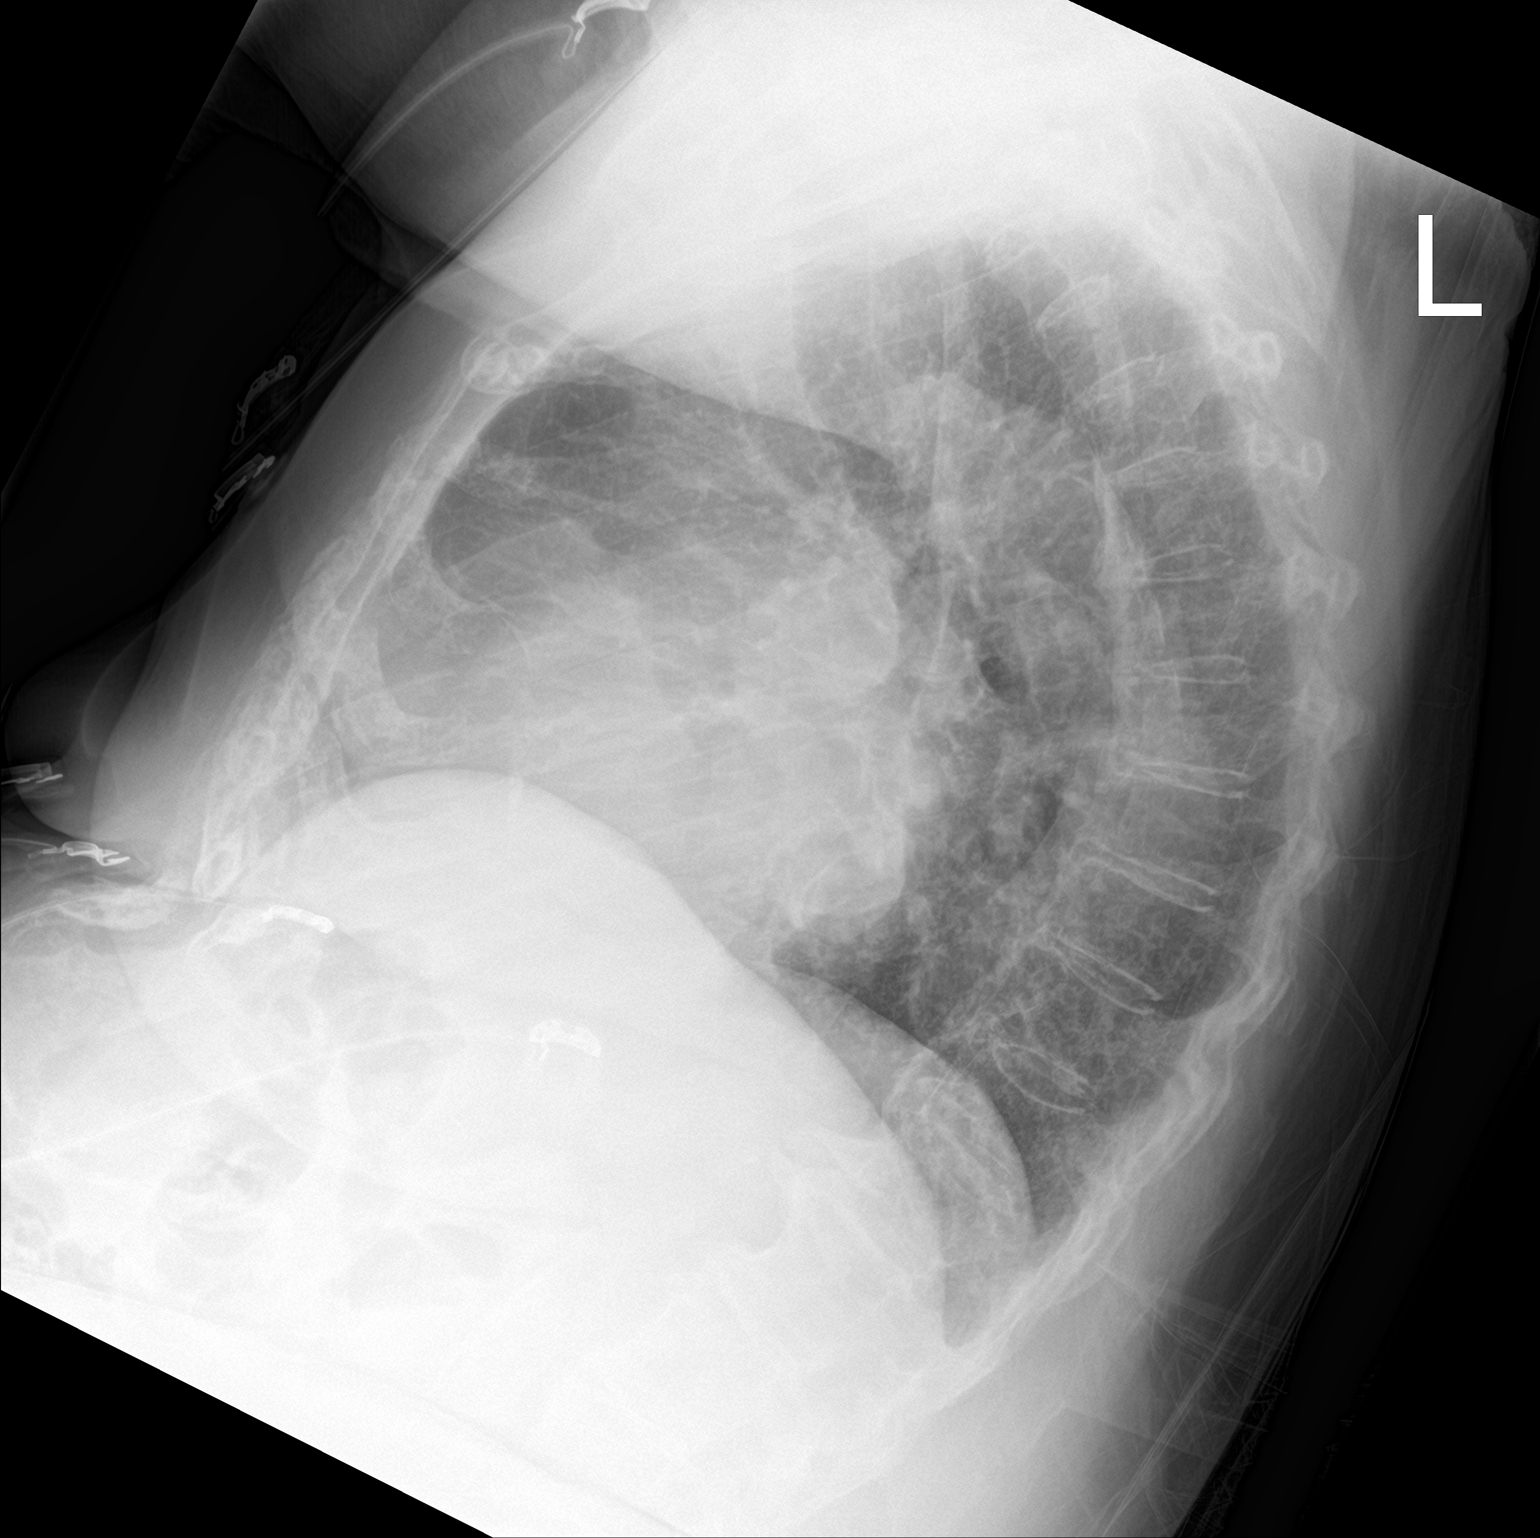

[chest ap strecther]
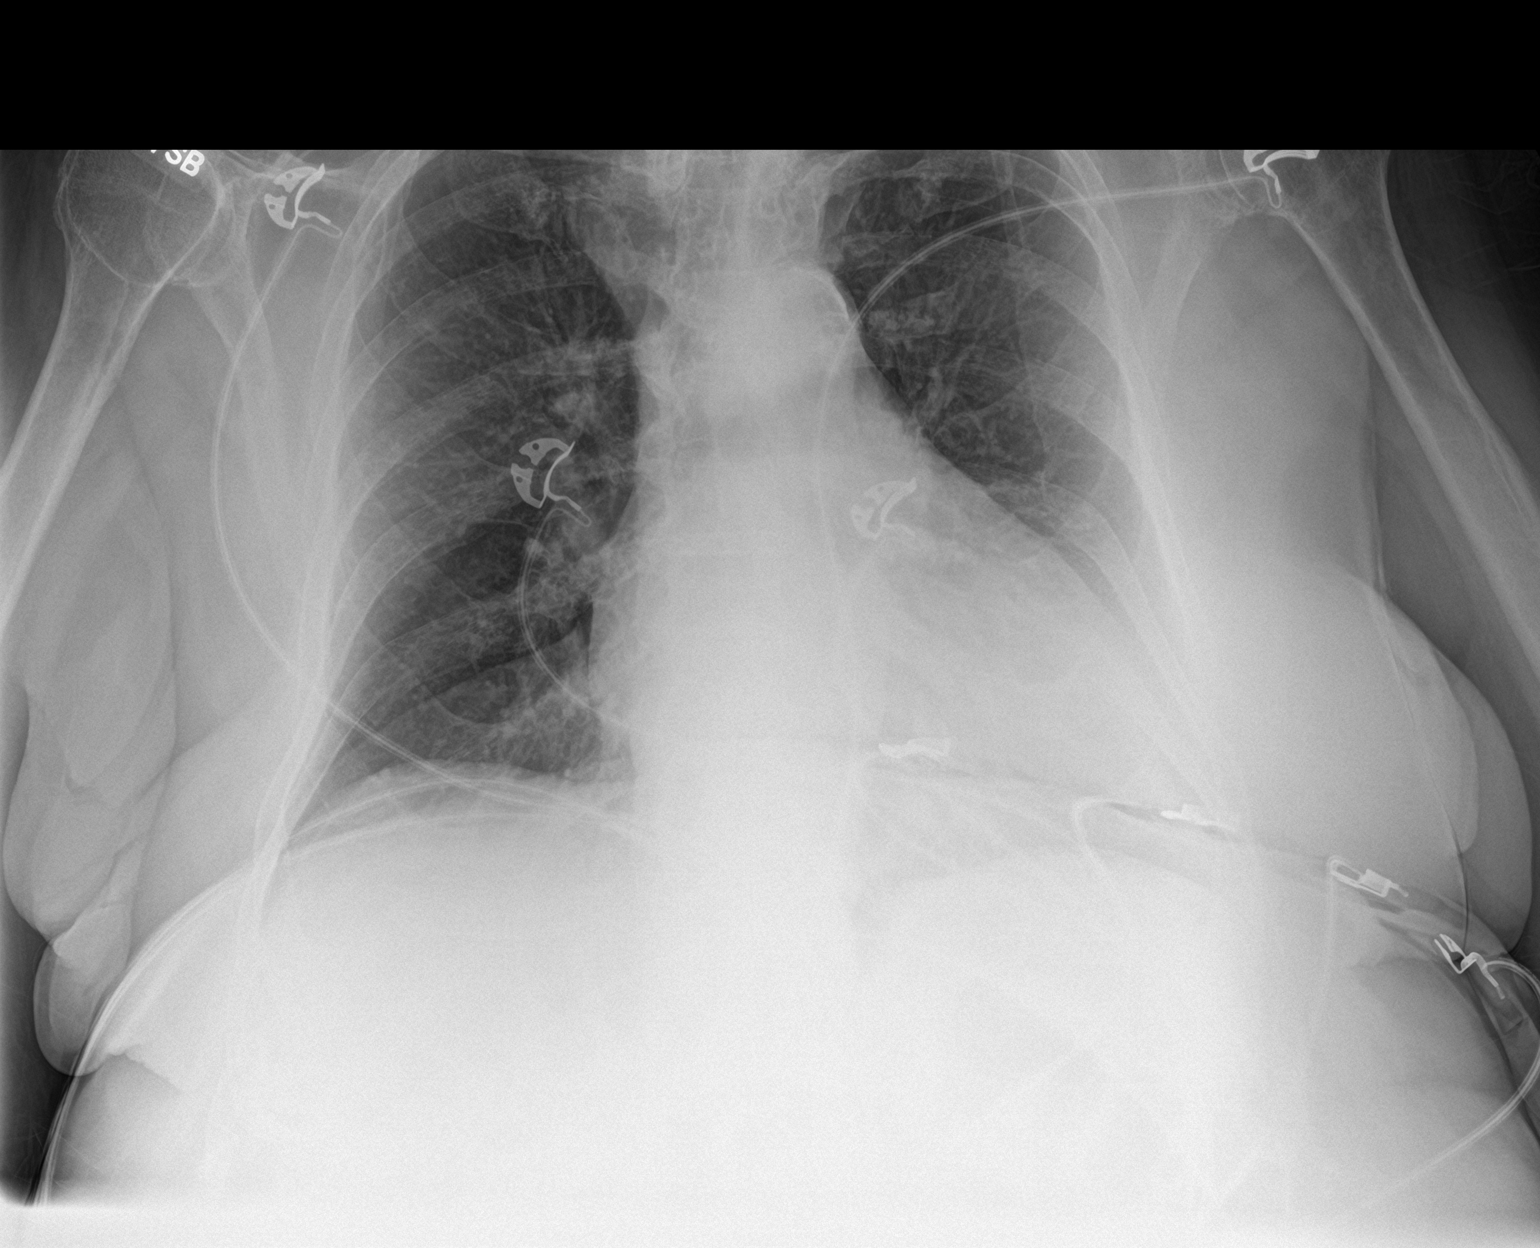

[2 of 2 positions shown; findings below may reference images not displayed]

FINDINGS: Mild, chronic appearing increased lung markings are seen without
evidence of acute infiltrate, pleural effusion or pneumothorax. The
heart size is within normal limits. Mild, stable right paratracheal
vascular prominence is seen. There is moderate severity
calcification of the aortic arch. Degenerative changes are seen
throughout the thoracic spine.
IMPRESSION: No active cardiopulmonary disease.

## 2021-12-16 DEATH — deceased
